# Patient Record
Sex: Male | Born: 1965 | Race: White | Hispanic: No | Marital: Married | Smoking: Never smoker
Health system: Southern US, Community
[De-identification: ages and names within clinical notes are randomized; demographics above are authoritative.]

## PROBLEM LIST (undated history)

## (undated) DIAGNOSIS — I10 Essential (primary) hypertension: Secondary | ICD-10-CM

---

## 2008-04-16 ENCOUNTER — Ambulatory Visit (HOSPITAL_COMMUNITY): Admission: RE | Admit: 2008-04-16 | Discharge: 2008-04-16 | Payer: Self-pay | Admitting: Emergency Medicine

## 2008-04-16 ENCOUNTER — Inpatient Hospital Stay (HOSPITAL_COMMUNITY): Admission: EM | Admit: 2008-04-16 | Discharge: 2008-04-18 | Payer: Self-pay | Admitting: Emergency Medicine

## 2008-04-16 ENCOUNTER — Ambulatory Visit: Payer: Self-pay | Admitting: Vascular Surgery

## 2008-07-14 ENCOUNTER — Ambulatory Visit (HOSPITAL_COMMUNITY): Admission: RE | Admit: 2008-07-14 | Discharge: 2008-07-14 | Payer: Self-pay | Admitting: Neurology

## 2011-02-12 NOTE — Discharge Summary (Signed)
Timothy Schmitt, Timothy Schmitt NO.:  [id number]   MEDICAL RECORD NO.:  [id number]          PATIENT TYPE:  INP   LOCATION:  4704                         FACILITY:  [facility]   PHYSICIAN:  Melvyn Novas, M.D.  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   MEDICINES AT THE TIME OF DISCHARGE:  1. Plavix 75 mg a day.  2. Aspirin 81 mg a day.   STUDIES PERFORMED:  1. Pre-MRI screening shows no radiopaque foreign bodies identified      within the eye.  2. MRA of the neck shows possible left internal carotid artery      dissection at the skull base.  3. CT angio of the head and neck shows normal CT angio of the head.  4. CT angio of the neck shows focal dissection of the left internal      carotid artery just below and within the proximal carotid canal.  5. Chest x-ray, no acute disease.  6. MRI of the brain shows no acute stroke.  7. MRA of the brain shows short segment dissection at the left      internal carotid artery and internal carotid artery narrowing      estimated at 50%.  Beyond that, the vessels appear normal.  8. EKG shows sinus bradycardia.   LABORATORY STUDIES:  INR on the day of discharge 1.2, homocystine 6.9,  HIV nonreactive, folate 19.6, vitamin B12 606, TSH 4.295, syphilis  nonreactive, hemoglobin A1c 5.5, C-reactive protein 0.4, and sed rate 1.  Lipid profile with cholesterol 120, triglycerides 143, HDL 24, LDL 67.  Blood type is O+.  Chemistry normal.  CBC normal.   HISTORY OF PRESENT ILLNESS:  Timothy Schmitt is a 45 year old right-  handed Caucasian male, who has no significant medical history.  He  worked 36 hours prior to admission with some funny vision in his left  eye when he awoke as if something were in his eye.  He also has some  blood  shot on the left according to his wife and left eyelid that was  slightly droopy.  He denied any trauma to his head or neck, although he  did bump his head with a pole on April 01, 2008, but he has had no  symptoms since that time.  [facility].  An MRA that he had as an outpatient showed probable internal  carotid artery dissection.  He was brought to the hospital for further  evaluation.  He is not a t-PA candidate secondary to he has had no  stroke symptoms.   HOSPITAL COURSE:  MRI and CTA of neck also confirmed left ICA dissection  possibly due to recent moving and heavy lifting, but no real source  identified.  He was initially placed on full-dose Lovenox and Coumadin  for stroke prevention, then changed to aspirin and  Plavix, as there is a  clinical benefit of Coumadin over antiplatelet.  He will be discharged  on aspirin and Plavix x3 months, then one will be discontinued.  We will  plan an angiogram at that time to evaluate dissection and possible  etiologies.  He had a full hypercoagulable workup done in the hospital.  He does have no neurologic deficits other than the Horner disease and is  stable for discharge home.  His family is supportive.   CONDITION AT DISCHARGE:  Horner's is resolving, though he still has some  left Horner's with ptosis and small pupil reaction on the left.  He is  otherwise neurologically intact.   DISCHARGE/PLAN:  1. Discharge home with family.  2. Aspirin and Plavix for stroke prevention.  3. Follow up with primary care physician for any risk factor control      within the next month.  4. Follow up with Dr. Porfirio Mylar Dohmeier in 2 months.  5. Followup cerebral angiogram in 2-3 months.  6. Stop one of the antiplatelet agents in 3 months.      Annie Main, N.P.      Melvyn Novas, M.D.  Electronically Signed    SB/MEDQ  D:  04/18/2008  T:  04/19/2008  Job:  [id number]   cc:   Quita Skye. Hart Rochester, M.D.   Gaspar Garbe, M.D.

## 2011-02-12 NOTE — Consult Note (Signed)
NAMEMCKINNLEY, SMITHEY NO.:  [id number]   MEDICAL RECORD NO.:  [id number]          PATIENT TYPE:  INP   LOCATION:  4704                         FACILITY:  [facility]   PHYSICIAN:  Quita Skye. Hart Rochester, M.D.  DATE OF BIRTH:  [birth year]   DATE OF CONSULTATION:  DATE OF DISCHARGE:                                 CONSULTATION   CHIEF COMPLAINT:  Acute left internal carotid dissection  (intracerebral).   HISTORY OF PRESENT ILLNESS:  This healthy 45 year old patient 36 hours  ago noted some funny vision in his left eye when he awoke as if  something were in his eye.  He also had some bloodshot eye on the  left, according to his wife in the left eyelid droop slightly.  He  denied any weakness, syncope, decreased sensation, aphasia, or other  neurologic symptoms.  He also denied any trauma to his head and neck  area; although, he did bump his head with a pole on April 01, 2008, but he  has had no symptoms since that time.  He has had no history of  neurologic issues in the past.  He was evaluated by [facility] Cleta Alberts [facility] and MR angiogram suggested the possible probable with  the internal carotid artery intracerebrally and the CT angiogram was  performed, which I have reviewed with Dr. Valetta Close which reveals 50-  70% narrowing of the internal carotid artery in the carotid canal on the  left side suggestive of a focal dissection.   PAST MEDICAL HISTORY:  Negative for diabetes, coronary artery disease,  COPD, stroke, or hypertension.   PAST SURGICAL HISTORY:  None.   ALLERGIES:  CODEINE.   MEDICATIONS:  None.   SOCIAL HISTORY:  Denies tobacco or ethanol use.  He is self-employed.   REVIEW OF SYSTEMS:  Totally unremarkable.   PHYSICAL EXAMINATION:  VITAL SIGNS:  Temperature 97.6, blood pressure  150/90, and heart rates 56.  GENERAL:  He is alert and oriented, healthy-appearing middle-aged male  in no apparent distress, alert and oriented x3.  NECK:  Supple.   3+ carotid pulses with no tenderness.  No bruits are  audible.  HEENT:  Left eyelid is slightly ptotic with some mild conjunctivitis on  the left.  No sensation changes or no speech problems.  He had good  strength bilaterally.  CHEST:  Clear to auscultation.  ABDOMEN:  Soft, nontender with no palpable masses.  CARDIOVASCULAR:  Regular rhythm with no murmurs.  EXTREMITY:  A 3+ femoral popliteal, dorsalis pedis pulses palpable  bilaterally.  Carotid pulses 3+ bilaterally.   IMPRESSION:  Probable acute dissection of left internal carotid artery  (intracerebral) in the carotid canal.   RECOMMENDATIONS:  We would obtain a Neurology consult for management of  this lesion.  It is not accessible to the vascular surgeon.  Most  likely, the best treatment would be 6 months of anticoagulation with  Coumadin and aspirin and at that time, if lesion is not apparent by CT  angiography, could consider discontinuing Coumadin therapy.  I will be  happy to see the  patient again at your request.      Quita Skye. Hart Rochester, M.D.  Electronically Signed     JDL/MEDQ  D:  04/16/2008  T:  04/17/2008  Job:  [id number]

## 2011-06-28 LAB — DIFFERENTIAL
Basophils Absolute: 0
Basophils Relative: 1
Lymphocytes Relative: 17
Monocytes Absolute: 0.4
Neutro Abs: 4.7
Neutrophils Relative %: 74

## 2011-06-28 LAB — POCT I-STAT, CHEM 8
Chloride: 102
Creatinine, Ser: 1.1

## 2011-06-28 LAB — TYPE AND SCREEN
ABO/RH(D): O POS
Antibody Screen: NEGATIVE

## 2011-06-28 LAB — ANTI-NEUTROPHIL ANTIBODY

## 2011-06-28 LAB — LIPID PANEL
Cholesterol: 120
Triglycerides: 143
VLDL: 29

## 2011-06-28 LAB — PROTIME-INR
INR: 1
INR: 1.1
INR: 1.2
Prothrombin Time: 15.7 — ABNORMAL HIGH

## 2011-06-28 LAB — EXTRACTABLE NUCLEAR ANTIGEN ANTIBODY
SSA (Ro) (ENA) Antibody, IgG: <0.2 AI (ref <1.0)
SSB (La) (ENA) Antibody, IgG: 0.2 AI (ref <1.0)
Scleroderma (Scl-70) (ENA) Antibody, IgG: <0.2 AI (ref <1.0)
ds DNA Ab: 1 IU/mL (ref <5)

## 2011-06-28 LAB — CBC
HCT: 45.4
Hemoglobin: 15.6
MCV: 89.3
Platelets: 195
RBC: 5.08
RDW: 12.8

## 2011-06-28 LAB — RPR: RPR Ser Ql: NONREACTIVE

## 2011-06-28 LAB — ABO/RH: ABO/RH(D): O POS

## 2011-06-28 LAB — METHYLMALONIC ACID, SERUM: Methylmalonic Acid, Quantitative: 124 nmol/L (ref 87–318)

## 2011-06-28 LAB — TSH: TSH: 4.295

## 2011-06-28 LAB — HIV ANTIBODY (ROUTINE TESTING W REFLEX): HIV: NONREACTIVE

## 2011-06-28 LAB — HEMOGLOBIN A1C: Mean Plasma Glucose: 119

## 2014-02-01 ENCOUNTER — Other Ambulatory Visit: Payer: Self-pay | Admitting: Internal Medicine

## 2014-02-01 DIAGNOSIS — N5089 Other specified disorders of the male genital organs: Secondary | ICD-10-CM

## 2014-02-04 ENCOUNTER — Ambulatory Visit
Admission: RE | Admit: 2014-02-04 | Discharge: 2014-02-04 | Disposition: A | Discharge status: Home / Self Care | Payer: Managed Care, Other (non HMO) | Source: Ambulatory Visit | Attending: Internal Medicine | Admitting: Internal Medicine

## 2014-02-04 DIAGNOSIS — N5089 Other specified disorders of the male genital organs: Secondary | ICD-10-CM

## 2016-12-24 ENCOUNTER — Emergency Department (HOSPITAL_COMMUNITY)
Admission: EM | Admit: 2016-12-24 | Discharge: 2016-12-24 | Disposition: A | Discharge status: Home / Self Care | Payer: Managed Care, Other (non HMO) | Attending: Emergency Medicine | Admitting: Emergency Medicine

## 2016-12-24 ENCOUNTER — Emergency Department (HOSPITAL_COMMUNITY): Payer: Managed Care, Other (non HMO)

## 2016-12-24 ENCOUNTER — Encounter (HOSPITAL_COMMUNITY): Payer: Self-pay

## 2016-12-24 DIAGNOSIS — R42 Dizziness and giddiness: Secondary | ICD-10-CM | POA: Diagnosis not present

## 2016-12-24 DIAGNOSIS — R202 Paresthesia of skin: Secondary | ICD-10-CM | POA: Insufficient documentation

## 2016-12-24 DIAGNOSIS — Z5181 Encounter for therapeutic drug level monitoring: Secondary | ICD-10-CM | POA: Diagnosis not present

## 2016-12-24 DIAGNOSIS — R930 Abnormal findings on diagnostic imaging of skull and head, not elsewhere classified: Secondary | ICD-10-CM | POA: Insufficient documentation

## 2016-12-24 DIAGNOSIS — R51 Headache: Secondary | ICD-10-CM | POA: Diagnosis present

## 2016-12-24 LAB — PROTIME-INR
INR: 1.01
PROTHROMBIN TIME: 13.3 s (ref 11.4–15.2)

## 2016-12-24 LAB — BASIC METABOLIC PANEL
Anion gap: 7 (ref 5–15)
BUN: 13 mg/dL (ref 6–20)
CO2: 25 mmol/L (ref 22–32)
CREATININE: 0.95 mg/dL (ref 0.61–1.24)
Calcium: 9.3 mg/dL (ref 8.9–10.3)
Chloride: 103 mmol/L (ref 101–111)
GFR calc Af Amer: >60 mL/min (ref >60)
Glucose, Bld: 92 mg/dL (ref 65–99)
Potassium: 4.1 mmol/L (ref 3.5–5.1)
SODIUM: 135 mmol/L (ref 135–145)

## 2016-12-24 LAB — CBC WITH DIFFERENTIAL/PLATELET
Basophils Absolute: 0.1 10*3/uL (ref 0.0–0.1)
Basophils Relative: 1 %
EOS ABS: 0.3 10*3/uL (ref 0.0–0.7)
EOS PCT: 5 %
HCT: 45.6 % (ref 39.0–52.0)
Hemoglobin: 15.8 g/dL (ref 13.0–17.0)
LYMPHS ABS: 1.5 10*3/uL (ref 0.7–4.0)
Lymphocytes Relative: 27 %
MCH: 30.1 pg (ref 26.0–34.0)
MCHC: 34.6 g/dL (ref 30.0–36.0)
MCV: 86.9 fL (ref 78.0–100.0)
MONOS PCT: 7 %
Monocytes Absolute: 0.4 10*3/uL (ref 0.1–1.0)
NEUTROS ABS: 3.3 10*3/uL (ref 1.7–7.7)
Neutrophils Relative %: 60 %
PLATELETS: 190 10*3/uL (ref 150–400)
RBC: 5.25 MIL/uL (ref 4.22–5.81)
RDW: 12.8 % (ref 11.5–15.5)
WBC: 5.4 10*3/uL (ref 4.0–10.5)

## 2016-12-24 LAB — APTT: aPTT: 34 seconds (ref 24–36)

## 2016-12-24 MED ORDER — MECLIZINE HCL 32 MG PO TABS: 32.0000 mg | ORAL_TABLET | Freq: Three times a day (TID) | ORAL | 0 refills | Status: DC | PRN

## 2016-12-24 MED ORDER — GADOBENATE DIMEGLUMINE 529 MG/ML IV SOLN
15.0000 mL | Freq: Once | INTRAVENOUS | Status: AC
  Administered 2016-12-24: 15 mL via INTRAVENOUS

## 2016-12-24 NOTE — ED Notes (Signed)
Patient states he has been monitoring his blood pressure for for several days and is suppose to get a blood pressure cuff from his MD to monitor his b/p for 24 hours. States this am felt pressure in the right side of his head and felt "fuzzy" states if he turns his head quickly to either side he gets dizzy. Wife at bedside.

## 2016-12-24 NOTE — Discharge Instructions (Signed)
Please take her meclizine and perform your Epley maneuvers to help with your vertigo. Please follow-up with neurology for further management. If any symptoms change or worsen or you begin to having new symptoms, please return to the nearest emergency department.

## 2016-12-24 NOTE — ED Notes (Signed)
Patient being transported to MRI

## 2016-12-24 NOTE — ED Provider Notes (Signed)
[facility] Provider Note   CSN: [id number] Arrival date & time: 12/24/16  1227  By signing my name below, I, Marnette Burgessyan Andrew Long, attest that this documentation has been prepared under the direction and in the presence of Heide Scaleshristopher J Elston Aldape, MD. Electronically Signed: Marnette Burgessyan Andrew Long, Scribe. 12/24/2016. 1:36 PM.   History   Chief Complaint Chief Complaint  Patient presents with  . Headache   The history is provided by the patient, medical records and the spouse. No language interpreter was used.  Neurologic Problem  This is a new problem. The current episode started 3 to 5 hours ago. The problem occurs constantly. The problem has not changed since onset.Associated symptoms include headaches. Pertinent negatives include no chest pain and no abdominal pain. The symptoms are aggravated by twisting (position). Nothing relieves the symptoms. He has tried nothing for the symptoms. The treatment provided no relief.  Headache   This is a new problem. The current episode started 3 to 5 hours ago. The problem occurs constantly. The problem has not changed since onset.  HPI Comments:  Timothy Schmitt is a 51 y.o. male with a past medical hx significant for prior intracranial dissection, who presents to the Emergency Department complaining of a right temporal discomfort, dizziness, and R leg tingling onset 0830 this morning. Pt reports waking up at his baseline and eating his normal breakfast followed by his daily inversion table therapy during which he also twists and cricks his neck trying to stretch it out. After getting off the table, he felt room spinning dizziness and had to sit on one knee for a minute to keep his balance. He then went to work and felt some slight temporal discomfort with right eye "visual disturbance with facial tingling". He doesn't classify it as pain but states he felt discomfort saying "something wasn't quite right". His wife reports a past h/o left ICA dissection in  2009 treated with ASA and Plavix for three months which he reports he feels somewhat similar to today. No current antiplatelet or anticoagulant use. She also states he has been having persistent blood pressure issues this past month. After feeling this facial discomfort this morning, he called his PCP who referred him to come to [facility]. He states the dizziness is somewhat persistent at current time with associated very mild right leg numbness >left leg. Pt denies weakness and any other complaints at this time.    History reviewed. No pertinent past medical history.  There are no active problems to display for this patient.  History reviewed. No pertinent surgical history.  Home Medications    Prior to Admission medications   Medication Sig Start Date End Date Taking? Authorizing Provider  ibuprofen (ADVIL,MOTRIN) 200 MG tablet Take 200-800 mg by mouth every 6 (six) hours as needed for headache.   Yes Historical Provider, MD   Family History History reviewed. No pertinent family history.  Social History Social History  Substance Use Topics  . Smoking status: Never Smoker  . Smokeless tobacco: Never Used  . Alcohol use Not on file   Allergies   Codeine  Review of Systems Review of Systems  Constitutional: Negative for activity change, chills, diaphoresis and fatigue.  HENT: Negative for congestion and rhinorrhea.   Eyes: Positive for visual disturbance.  Respiratory: Negative for cough, chest tightness, wheezing and stridor.   Cardiovascular: Negative for chest pain and leg swelling.  Gastrointestinal: Negative for abdominal distention, abdominal pain, blood in stool, constipation and diarrhea.  Genitourinary: Negative for  difficulty urinating, dysuria and flank pain.  Musculoskeletal: Negative for back pain and gait problem.  Skin: Negative for rash and wound.  Neurological: Positive for dizziness and headaches. Negative for weakness and light-headedness.       Positive facial  tingling  Psychiatric/Behavioral: Negative for agitation.  All other systems reviewed and are negative.   Physical Exam Updated Vital Signs BP (!) 149/104   Pulse 68   Temp 98 F (36.7 C) (Oral)   Resp 18   Ht 5\' 10"  (1.778 m)   Wt 163 lb (73.9 kg)   SpO2 98%   BMI 23.39 kg/m   Physical Exam  Constitutional: He is oriented to person, place, and time. He appears well-developed and well-nourished. No distress.  HENT:  Head: Normocephalic and atraumatic.  Right Ear: External ear normal.  Left Ear: External ear normal.  Nose: Nose normal.  Mouth/Throat: Oropharynx is clear and moist. No oropharyngeal exudate.  Eyes: Conjunctivae and EOM are normal. Pupils are equal, round, and reactive to light.  Neck: Normal range of motion. Neck supple.  Pulmonary/Chest: No stridor. No respiratory distress.  Abdominal: Soft. There is no tenderness. There is no rebound and no guarding.  Musculoskeletal: He exhibits no edema.  Neurological: He is alert and oriented to person, place, and time. He displays normal reflexes. No cranial nerve deficit. He exhibits normal muscle tone. Coordination normal.  Skin: Skin is warm. No rash noted. He is not diaphoretic. No erythema.    ED Treatments / Results  DIAGNOSTIC STUDIES:  Oxygen Saturation is 98% on RA, normal by my interpretation.    COORDINATION OF CARE:  1:30 PM Discussed treatment plan with pt at bedside including blood work, MRI Head/ Neck and pt agreed to plan.  1:38 PM Talked to Dr. Amada Jupiter in neurology who advised not to call code stroke but get a mri of head and neck.   Labs (all labs ordered are listed, but only abnormal results are displayed) Labs Reviewed  CBC WITH DIFFERENTIAL/PLATELET  BASIC METABOLIC PANEL  PROTIME-INR  APTT    EKG  EKG Interpretation  Date/Time:  Tuesday December 24 2016 12:57:28 EDT Ventricular Rate:  61 PR Interval:  190 QRS Duration: 90 QT Interval:  408 QTC Calculation: 410 R  Axis:   68 Text Interpretation:  Normal sinus rhythm Normal ECG When compared to prior, no significant changes seen.  No STEMI Confirmed by Rush Landmark MD, Marisela Line [phone]) on 12/24/2016 2:19:05 PM      Radiology Mr Maxine Glenn Head Wo Contrast  Result Date: 12/24/2016 CLINICAL DATA:  Vertigo, visual disturbance with facial tingling. History of left carotid dissection. EXAM: MR HEAD WITHOUT CONTRAST MR CIRCLE OF WILLIS WITHOUT CONTRAST MRA OF THE NECK WITHOUT AND WITH CONTRAST TECHNIQUE: Multiplanar, multiecho pulse sequences of the brain, circle of willis and surrounding structures were obtained without intravenous contrast. Angiographic images of the neck were obtained using MRA technique without and with intravenous contrast. CONTRAST:  15mL MULTIHANCE GADOBENATE DIMEGLUMINE 529 MG/ML IV SOLN COMPARISON:  CTA head neck 04/16/2008, 07/14/2008 FINDINGS: MR HEAD FINDINGS Brain: Ventricle size normal. Cerebral volume normal. Negative for acute or chronic infarction. Negative for hemorrhage mass or edema. Vascular: Normal arterial flow voids. Skull and upper cervical spine: Negative Sinuses/Orbits: Mild mucosal edema in the paranasal sinuses. Normal orbit. Other: None MR CIRCLE OF WILLIS FINDINGS Both vertebral arteries patent to the basilar. PICA patent bilaterally. Left AICA patent. Superior cerebellar and posterior cerebral arteries patent without stenosis. Fetal origin left posterior cerebral artery. Basilar is  widely patent. Distal internal carotid artery is patent through the cavernous segment without irregularity or stenosis. Prior dissection was at the level of the left carotid canal and this vessel appears normal today. Anterior and middle cerebral arteries appear normal bilaterally. Negative for cerebral aneurysm. MRA NECK FINDINGS Normal aortic arch. Carotid bifurcation normal. Both vertebral arteries are normal. Antegrade flow in both carotid and vertebral arteries is noted. IMPRESSION: Negative MRI of the  brain Negative MRA circle of Willis Negative MRA neck History of prior left carotid dissection at the level of the carotid canal at the skullbase. This has healed and appears normal. Electronically Signed   By: Marlan Palau M.D.   On: 12/24/2016 15:54   Mr Angiogram Neck W Or Wo Contrast  Result Date: 12/24/2016 CLINICAL DATA:  Vertigo, visual disturbance with facial tingling. History of left carotid dissection. EXAM: MR HEAD WITHOUT CONTRAST MR CIRCLE OF WILLIS WITHOUT CONTRAST MRA OF THE NECK WITHOUT AND WITH CONTRAST TECHNIQUE: Multiplanar, multiecho pulse sequences of the brain, circle of willis and surrounding structures were obtained without intravenous contrast. Angiographic images of the neck were obtained using MRA technique without and with intravenous contrast. CONTRAST:  15mL MULTIHANCE GADOBENATE DIMEGLUMINE 529 MG/ML IV SOLN COMPARISON:  CTA head neck 04/16/2008, 07/14/2008 FINDINGS: MR HEAD FINDINGS Brain: Ventricle size normal. Cerebral volume normal. Negative for acute or chronic infarction. Negative for hemorrhage mass or edema. Vascular: Normal arterial flow voids. Skull and upper cervical spine: Negative Sinuses/Orbits: Mild mucosal edema in the paranasal sinuses. Normal orbit. Other: None MR CIRCLE OF WILLIS FINDINGS Both vertebral arteries patent to the basilar. PICA patent bilaterally. Left AICA patent. Superior cerebellar and posterior cerebral arteries patent without stenosis. Fetal origin left posterior cerebral artery. Basilar is widely patent. Distal internal carotid artery is patent through the cavernous segment without irregularity or stenosis. Prior dissection was at the level of the left carotid canal and this vessel appears normal today. Anterior and middle cerebral arteries appear normal bilaterally. Negative for cerebral aneurysm. MRA NECK FINDINGS Normal aortic arch. Carotid bifurcation normal. Both vertebral arteries are normal. Antegrade flow in both carotid and vertebral  arteries is noted. IMPRESSION: Negative MRI of the brain Negative MRA circle of Willis Negative MRA neck History of prior left carotid dissection at the level of the carotid canal at the skullbase. This has healed and appears normal. Electronically Signed   By: Marlan Palau M.D.   On: 12/24/2016 15:54   Mr Brain Wo Contrast  Result Date: 12/24/2016 CLINICAL DATA:  Vertigo, visual disturbance with facial tingling. History of left carotid dissection. EXAM: MR HEAD WITHOUT CONTRAST MR CIRCLE OF WILLIS WITHOUT CONTRAST MRA OF THE NECK WITHOUT AND WITH CONTRAST TECHNIQUE: Multiplanar, multiecho pulse sequences of the brain, circle of willis and surrounding structures were obtained without intravenous contrast. Angiographic images of the neck were obtained using MRA technique without and with intravenous contrast. CONTRAST:  15mL MULTIHANCE GADOBENATE DIMEGLUMINE 529 MG/ML IV SOLN COMPARISON:  CTA head neck 04/16/2008, 07/14/2008 FINDINGS: MR HEAD FINDINGS Brain: Ventricle size normal. Cerebral volume normal. Negative for acute or chronic infarction. Negative for hemorrhage mass or edema. Vascular: Normal arterial flow voids. Skull and upper cervical spine: Negative Sinuses/Orbits: Mild mucosal edema in the paranasal sinuses. Normal orbit. Other: None MR CIRCLE OF WILLIS FINDINGS Both vertebral arteries patent to the basilar. PICA patent bilaterally. Left AICA patent. Superior cerebellar and posterior cerebral arteries patent without stenosis. Fetal origin left posterior cerebral artery. Basilar is widely patent. Distal internal carotid artery is  patent through the cavernous segment without irregularity or stenosis. Prior dissection was at the level of the left carotid canal and this vessel appears normal today. Anterior and middle cerebral arteries appear normal bilaterally. Negative for cerebral aneurysm. MRA NECK FINDINGS Normal aortic arch. Carotid bifurcation normal. Both vertebral arteries are normal.  Antegrade flow in both carotid and vertebral arteries is noted. IMPRESSION: Negative MRI of the brain Negative MRA circle of Willis Negative MRA neck History of prior left carotid dissection at the level of the carotid canal at the skullbase. This has healed and appears normal. Electronically Signed   By: Marlan Palau M.D.   On: 12/24/2016 15:54    Procedures Procedures (including critical care time)  Medications Ordered in ED Medications  gadobenate dimeglumine (MULTIHANCE) injection 15 mL (15 mLs Intravenous Contrast Given 12/24/16 1514)     Initial Impression / Assessment and Plan / ED Course  I have reviewed the triage vital signs and the nursing notes.  Pertinent labs & imaging results that were available during my care of the patient were reviewed by me and considered in my medical decision making (see chart for details).     Timothy Schmitt is a 51 y.o. male with a past medical hx significant for prior intracranial dissection, who presents to the Emergency Department complaining of a right temporal discomfort, dizziness, and R leg tingling onset 0830 this morning.   History and exam are seen above.   On exam, patient had tingling in his right leg versus left. Patient had no facial numbness or facial droop. No disorientation or other focal neurologic deficits. No weakness. No coordination problems. Normal finger-nose-finger. Clear lungs. Nontender chest. Nontender abdomen. No neck tenderness.  Shortly after exam and revelation that symptoms are similar to prior intracranial dissection in the past, neurology was called. They felt patient did not need to be a code stroke given the symptoms. They did however recommend MRI and MRA of the head and neck to further evaluate.  Patient's imaging showed resolution of prior dissection and no evidence of acute stroke or dissection. Patient's lab testing otherwise unremarkable.   Neurology saw the patient after imaging and felt patient's  symptoms were BP PV.   Dix-Hallpike maneuver was performed and patient had severe nystagmus and symptoms. Supplemented was performed with improvement in symptoms. Patient will be given prescription for meclizine and follow-up with neurology.  Do not feel patient has a stroke however, patient was given strict return precautions for any new or worsening symptoms. Patient had no other questions or concerns and patient was discharged in good condition with improvement in presenting symptoms.     Final Clinical Impressions(s) / ED Diagnoses   Final diagnoses:  Dizziness  Vertigo    New Prescriptions New Prescriptions   MECLIZINE (ANTIVERT) 32 MG TABLET    Take 1 tablet (32 mg total) by mouth 3 (three) times daily as needed.   I personally performed the services described in this documentation, which was scribed in my presence. The recorded information has been reviewed and is accurate.  Clinical Impression: 1. Vertigo   2. Dizziness     Disposition: Discharge  Condition: Good  I have discussed the results, Dx and Tx plan with the pt(& family if present). He/she/they expressed understanding and agree(s) with the plan. Discharge instructions discussed at great length. Strict return precautions discussed and pt &/or family have verbalized understanding of the instructions. No further questions at time of discharge.    New Prescriptions  MECLIZINE (ANTIVERT) 32 MG TABLET    Take 1 tablet (32 mg total) by mouth 3 (three) times daily as needed.    Follow Up: [facility] [address]     [address] [place] [address] [phone] Schedule an appointment as soon as possible for a visit    [facility] EMERGENCY DEPARTMENT [address] [id number] mc [place] [address] [phone]  If symptoms worsen      Heide Scales, MD 12/24/16 2145

## 2016-12-24 NOTE — ED Triage Notes (Signed)
Pt presents with onset of R temporal headache, "fuzzy" feeling and dizziness when he turns his head.  Pt has been seen by PCP x 1 month for hypertension.

## 2016-12-24 NOTE — Consult Note (Signed)
Requesting Physician: Dr. Julieanne Mansonegler    Chief Complaint:  Vertigo which has resolved.   History obtained from:  Patient    HPI:                                                                                                                                         Timothy Schmitt is an 51 y.o. male is that he awoke this morning and he felt at his baseline. He got on his inversion table to which he does every morning to stretch his back and upon getting back to vertical position he noted sudden onset of dizziness and vertigo. He unstrapped himself and had to get one need to keep his balance. This seemed to resolve and he went on to work however during the day he noted he just didn't feel right. He states he felt pressure behind his right eye and the right aspect of his head. He denies any neck pain neck discomfort or visual disturbances. He came to the emergency department and was feeling okay until he sat down in the waiting room when suddenly he noted the vertigo/dizziness occurred again. CT of head was obtained which showed no abnormalities MRI of brain/MRA of neck/MRA of head was obtained at this time MRI of brain and/MRA of head does not show anything suspecting dissection however the formal reading is still pending. Patient is asymptomatic at this time.  While doing the Dix-Hallpike and turning his head to the right he had severe vertigo and rotational nystagmus.  Date last known well: Date: 12/24/2016 Time last known well: Time: 08:30 tPA Given: No: symptoms resolved   History reviewed. No pertinent past medical history.  History reviewed. No pertinent surgical history.  History reviewed. No pertinent family history. Social History:  reports that he has never smoked. He has never used smokeless tobacco. His alcohol and drug histories are not on file.  Allergies:  Allergies  Allergen Reactions  . Codeine Nausea And Vomiting    Medications:                                                                                                                            No current facility-administered medications for this encounter.    Current Outpatient Prescriptions  Medication Sig Dispense Refill  . ibuprofen (ADVIL,MOTRIN) 200 MG tablet Take 200-800 mg by mouth every 6 (  six) hours as needed for headache.       ROS:                                                                                                                                       History obtained from the patient  General ROS: negative for - chills, fatigue, fever, night sweats, weight gain or weight loss Psychological ROS: negative for - behavioral disorder, hallucinations, memory difficulties, mood swings or suicidal ideation Ophthalmic ROS: negative for - blurry vision, double vision, eye pain or loss of vision ENT ROS: negative for - vertigo and dizziness Allergy and Immunology ROS: negative for - hives or itchy/watery eyes Hematological and Lymphatic ROS: negative for - bleeding problems, bruising or swollen lymph nodes Endocrine ROS: negative for - galactorrhea, hair pattern changes, polydipsia/polyuria or temperature intolerance Respiratory ROS: negative for - cough, hemoptysis, shortness of breath or wheezing Cardiovascular ROS: negative for - chest pain, dyspnea on exertion, edema or irregular heartbeat Gastrointestinal ROS: negative for - abdominal pain, diarrhea, hematemesis, nausea/vomiting or stool incontinence Genito-Urinary ROS: negative for - dysuria, hematuria, incontinence or urinary frequency/urgency Musculoskeletal ROS: negative for - joint swelling or muscular weakness Neurological ROS: as noted in HPI Dermatological ROS: negative for rash and skin lesion changes  Neurologic Examination:                                                                                                      Blood pressure (!) 149/104, pulse 68, temperature 98 F (36.7 C), temperature source Oral, resp. rate  18, height 5\' 10"  (1.778 m), weight 73.9 kg (163 lb), SpO2 98 %.  HEENT-  Normocephalic, no lesions, without obvious abnormality.  Normal external eye and conjunctiva.  Normal TM's bilaterally.  Normal auditory canals and external ears. Normal external nose, mucus membranes and septum.  Normal pharynx. Cardiovascular- S1, S2 normal, pulses palpable throughout   Lungs- chest clear, no wheezing, rales, normal symmetric air entry Abdomen- normal findings: bowel sounds normal Extremities- no edema Lymph-no adenopathy palpable Musculoskeletal-no joint tenderness, deformity or swelling Skin-warm and dry, no hyperpigmentation, vitiligo, or suspicious lesions  Neurological Examination Mental Status: Alert, oriented, thought content appropriate.  Speech fluent without evidence of aphasia.  Able to follow 3 step commands without difficulty. Cranial Nerves: II: Discs flat bilaterally; Visual fields grossly normal,  III,IV, VI: ptosis not present, extra-ocular motions intact bilaterally, pupils equal, round, reactive to light and accommodation V,VII: smile symmetric, facial light touch sensation normal bilaterally VIII:  hearing normal bilaterally IX,X: uvula rises symmetrically XI: bilateral shoulder shrug XII: midline tongue extension Motor: Right : Upper extremity   5/5    Left:     Upper extremity   5/5  Lower extremity   5/5     Lower extremity   5/5 Tone and bulk:normal tone throughout; no atrophy noted Sensory: Pinprick and light touch intact throughout, bilaterally Deep Tendon Reflexes: 2+ and symmetric throughout Plantars: Right: downgoing   Left: downgoing Cerebellar: normal finger-to-nose, normal rapid alternating movements and normal heel-to-shin test Gait: Normal gait, heel to shin had some difficulty and fell to the right but this was corrected after the second time of attempting. No difficulty walking on heels or on use.       Lab Results: Basic Metabolic Panel:  Recent  Labs Lab 12/24/16 1302  NA 135  K 4.1  CL 103  CO2 25  GLUCOSE 92  BUN 13  CREATININE 0.95  CALCIUM 9.3    Liver Function Tests: No results for input(s): AST, ALT, ALKPHOS, BILITOT, PROT, ALBUMIN in the last 168 hours. No results for input(s): LIPASE, AMYLASE in the last 168 hours. No results for input(s): AMMONIA in the last 168 hours.  CBC:  Recent Labs Lab 12/24/16 1302  WBC 5.4  NEUTROABS 3.3  HGB 15.8  HCT 45.6  MCV 86.9  PLT 190    Cardiac Enzymes: No results for input(s): CKTOTAL, CKMB, CKMBINDEX, TROPONINI in the last 168 hours.  Lipid Panel: No results for input(s): CHOL, TRIG, HDL, CHOLHDL, VLDL, LDLCALC in the last 168 hours.  CBG: No results for input(s): GLUCAP in the last 168 hours.  Microbiology: No results found for this or any previous visit.  Coagulation Studies:  Recent Labs  12/24/16 1344  LABPROT 13.3  INR 1.01    Imaging: No results found.     Assessment and plan discussed with with attending physician and they are in agreement.    Felicie Morn PA-C [facility] [phone]  12/24/2016, 3:46 PM   Assessment: 51 y.o. male with transient vertigo status post using his inverted table and then returning to vertical position. Given negative MRI and positive Dix-Hallpike test most likely patient has loosened an otolith which is causing him vertigo.  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  2) I have provided the patient with up and out for modified Epley maneuver 3) could use meclizine, though I counseled him not to use it while driving. 4) he can follow up with outpatient neurology.  Ritta Slot, MD [facility] [phone]  If 7pm- 7am, please page neurology on call as listed in [facility].

## 2017-06-23 ENCOUNTER — Emergency Department (HOSPITAL_BASED_OUTPATIENT_CLINIC_OR_DEPARTMENT_OTHER)
Admission: EM | Admit: 2017-06-23 | Discharge: 2017-06-24 | Disposition: A | Discharge status: Home / Self Care | Payer: Managed Care, Other (non HMO) | Attending: Emergency Medicine | Admitting: Emergency Medicine

## 2017-06-23 ENCOUNTER — Emergency Department (HOSPITAL_BASED_OUTPATIENT_CLINIC_OR_DEPARTMENT_OTHER): Payer: Managed Care, Other (non HMO)

## 2017-06-23 DIAGNOSIS — R42 Dizziness and giddiness: Secondary | ICD-10-CM | POA: Diagnosis not present

## 2017-06-23 DIAGNOSIS — I10 Essential (primary) hypertension: Secondary | ICD-10-CM | POA: Diagnosis not present

## 2017-06-23 DIAGNOSIS — Z79899 Other long term (current) drug therapy: Secondary | ICD-10-CM | POA: Insufficient documentation

## 2017-06-23 DIAGNOSIS — R531 Weakness: Secondary | ICD-10-CM | POA: Insufficient documentation

## 2017-06-23 LAB — BASIC METABOLIC PANEL
ANION GAP: 7 (ref 5–15)
BUN: 12 mg/dL (ref 6–20)
CALCIUM: 9.3 mg/dL (ref 8.9–10.3)
CO2: 25 mmol/L (ref 22–32)
CREATININE: 1.12 mg/dL (ref 0.61–1.24)
Chloride: 105 mmol/L (ref 101–111)
Glucose, Bld: 128 mg/dL — ABNORMAL HIGH (ref 65–99)
Potassium: 3.8 mmol/L (ref 3.5–5.1)
SODIUM: 137 mmol/L (ref 135–145)

## 2017-06-23 LAB — CBC
HCT: 46.1 % (ref 39.0–52.0)
HEMOGLOBIN: 16.4 g/dL (ref 13.0–17.0)
MCH: 30 pg (ref 26.0–34.0)
MCHC: 35.6 g/dL (ref 30.0–36.0)
MCV: 84.3 fL (ref 78.0–100.0)
Platelets: 192 10*3/uL (ref 150–400)
RBC: 5.47 MIL/uL (ref 4.22–5.81)
RDW: 12.9 % (ref 11.5–15.5)
WBC: 11.6 10*3/uL — AB (ref 4.0–10.5)

## 2017-06-23 LAB — TROPONIN I: Troponin I: <0.03 ng/mL (ref <0.03)

## 2017-06-23 MED ORDER — MECLIZINE HCL 25 MG PO TABS
25.0000 mg | ORAL_TABLET | Freq: Once | ORAL | Status: AC
  Administered 2017-06-24: 25 mg via ORAL
  Filled 2017-06-23: qty 1

## 2017-06-23 MED ORDER — ONDANSETRON HCL 4 MG/2ML IJ SOLN
4.0000 mg | Freq: Once | INTRAMUSCULAR | Status: AC
  Administered 2017-06-24: 4 mg via INTRAVENOUS
  Filled 2017-06-23: qty 2

## 2017-06-23 MED ORDER — SODIUM CHLORIDE 0.9 % IV BOLUS (SEPSIS)
1000.0000 mL | Freq: Once | INTRAVENOUS | Status: AC
  Administered 2017-06-24: 1000 mL via INTRAVENOUS

## 2017-06-23 MED ORDER — KETOROLAC TROMETHAMINE 30 MG/ML IJ SOLN
30.0000 mg | Freq: Once | INTRAMUSCULAR | Status: AC
  Administered 2017-06-24: 30 mg via INTRAVENOUS
  Filled 2017-06-23: qty 1

## 2017-06-23 NOTE — ED Triage Notes (Signed)
Epigastric pain this am. Lightheaded. Almost felt like he was having vertigo. He has been vomited and having periods of diaphoresis since lunch. 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 His EKG was normal but he was hypertensive.

## 2017-06-23 NOTE — ED Notes (Signed)
ED Provider at bedside. 

## 2017-06-24 MED ORDER — MECLIZINE HCL 25 MG PO TABS: 25.0000 mg | ORAL_TABLET | Freq: Three times a day (TID) | ORAL | 0 refills | Status: DC | PRN

## 2017-06-24 NOTE — ED Notes (Signed)
Pt reports headache has decreased to 1/10, dizziness has subsided and he "feels much better".

## 2017-06-24 NOTE — ED Notes (Signed)
ED Provider at bedside. 

## 2017-06-24 NOTE — ED Provider Notes (Signed)
[facility] Note   CSN: [id number] Arrival date & time: 06/23/17  2055     History   Chief Complaint Chief Complaint  Patient presents with  . Hypertension  . Dizziness  . Weakness    HPI Timothy Schmitt is a 51 y.o. male.  The history is provided by the patient.  Hypertension  This is a new problem. The current episode started 6 to 12 hours ago. The problem occurs constantly. The problem has been gradually improving. Associated symptoms include headaches. Pertinent negatives include no chest pain, no abdominal pain and no shortness of breath. Nothing aggravates the symptoms. He has tried rest for the symptoms.  Dizziness  Quality:  Room spinning Severity:  Moderate Onset quality:  Gradual Timing:  Constant Progression:  Improving Chronicity:  New Relieved by:  Being still Worsened by:  Standing up Associated symptoms: headaches, nausea, vomiting and weakness   Associated symptoms: no chest pain, no hearing loss, no shortness of breath, no syncope, no tinnitus and no vision changes   Risk factors: hx of vertigo   Weakness  Primary symptoms include dizziness.  Primary symptoms include no visual change. Associated symptoms include vomiting and headaches. Pertinent negatives include no shortness of breath and no chest pain.  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  He completed course of ASA/plavix  PMH - distant h/o carotid dissection Home Medications    Prior to Admission medications   Medication Sig Start Date End Date Taking?  Authorizing Provider  ibuprofen (ADVIL,MOTRIN) 200 MG tablet Take 200-800 mg by mouth every 6 (six) hours as needed for headache.    [provider]  meclizine (ANTIVERT) 32 MG tablet Take 1 tablet (32 mg total) by mouth 3 (three) times daily as needed. 12/24/16   Tegeler, Canary Brim, MD    Family History No family history on file.  Social History Social History  Substance Use Topics  . Smoking status: Never Smoker  . Smokeless tobacco: Never Used  . Alcohol use Not on file     Allergies   Codeine   Review of Systems Review of Systems  Constitutional: Positive for diaphoresis. Negative for fever.  HENT: Negative for hearing loss and tinnitus.   Respiratory: Negative for shortness of breath.   Cardiovascular: Negative for chest pain and syncope.  Gastrointestinal: Positive for nausea and vomiting. Negative for abdominal pain.  Neurological: Positive for dizziness, weakness and headaches. Negative for syncope and numbness.  All other systems reviewed and are negative.    Physical Exam Updated Vital Signs BP (!) 162/102   Pulse 61   Temp 97.8 F (36.6 C)   Resp 17   Ht 1.778 m ( )   Wt 77.6 kg (171 lb)   SpO2 98%   BMI 24.54 kg/m   Physical Exam CONSTITUTIONAL: Well developed/well nourished HEAD: Normocephalic/atraumatic EYES: EOMI/PERRL, minimal horizontal nystagmus, no ptosis,  ENMT: Mucous membranes moist NECK: supple no meningeal signs, no bruits SPINE/BACK:entire spine nontender CV: S1/S2 noted, no murmurs/rubs/gallops noted LUNGS: Lungs are clear to auscultation bilaterally, no apparent distress ABDOMEN: soft, nontender,  no rebound or guarding GU:no cva tenderness NEURO:Awake/alert, face symmetric, no arm or leg drift is noted Equal 5/5 strength with shoulder abduction, elbow flex/extension, wrist flex/extension in upper extremities and equal hand grips bilaterally Equal 5/5 strength with hip flexion,knee flex/extension, foot dorsi/plantar  flexion Cranial nerves 3/4/5/6/04/07/09/11/12 tested and intact Gait normal without ataxia No past pointing Sensation to light touch intact in all extremities EXTREMITIES: pulses normal, full ROM SKIN: warm, color normal PSYCH: no abnormalities of mood noted, alert and oriented to situation    ED Treatments / Results  Labs (all labs ordered are listed, but only abnormal results are displayed) Labs Reviewed  BASIC METABOLIC PANEL - Abnormal; Notable for the following:       Result Value   Glucose, Bld 128 (*)    All other components within normal limits  CBC - Abnormal; Notable for the following:    WBC 11.6 (*)    All other components within normal limits  TROPONIN I    EKG  EKG Interpretation  Date/Time:  Monday June 23 2017 21:06:15 EDT Ventricular Rate:  72 PR Interval:  178 QRS Duration: 88 QT Interval:  396 QTC Calculation: 433 R Axis:   59 Text Interpretation:  Normal sinus rhythm Normal ECG No significant change since last tracing Confirmed by Zadie Rhine ([id number]) on 06/23/2017 11:36:48 PM       Radiology Dg Chest 2 View  Result Date: 06/23/2017 CLINICAL DATA:  Central chest pain, dizziness and diaphoresis since this morning. EXAM: CHEST  2 VIEW COMPARISON:  04/16/2008 FINDINGS: The heart size and mediastinal contours are within normal limits. Both lungs are clear. No acute nor suspicious osseous abnormalities. Mild degeneratiive change along the midthoracic spine. IMPRESSION: No active cardiopulmonary disease. Electronically Signed   By: Tollie Eth M.D.   On: 06/23/2017 21:23    Procedures Procedures (including critical care time)  Medications Ordered in ED Medications  ketorolac (TORADOL) 30 MG/ML injection 30 mg (30 mg Intravenous Given 06/24/17 0001)  ondansetron (ZOFRAN) injection 4 mg (4 mg Intravenous Given 06/24/17 0001)  meclizine (ANTIVERT) tablet 25 mg (25 mg Oral Given 06/24/17 0001)  sodium chloride 0.9 % bolus 1,000 mL (0 mLs Intravenous  Stopped 06/24/17 0044)     Initial Impression / Assessment and Plan / ED Course  I have reviewed the triage vital signs and the nursing notes.  Pertinent labs & imaging results that were available during my care of the patient were reviewed by me and considered in my medical decision making (see chart for details).     Pt well appearing No distress No neuro deficits He had MRI since previous dissection and MRI was negative   Pt improved in the ED No neuro deficits I have low suspicion for recurrent carotid dissection or other neurologic catastrophe Will need BP check as outpatient We discussed strict ER return precautions  Final Clinical Impressions(s) / ED Diagnoses   Final diagnoses:  Vertigo  Essential hypertension    New Prescriptions Discharge Medication List as of 06/24/2017  1:15 AM       Zadie Rhine, MD 06/24/17 [phone]

## 2017-06-24 NOTE — Discharge Instructions (Signed)

## 2017-08-04 ENCOUNTER — Encounter (INDEPENDENT_AMBULATORY_CARE_PROVIDER_SITE_OTHER): Payer: Self-pay

## 2017-08-04 ENCOUNTER — Ambulatory Visit: Payer: Managed Care, Other (non HMO) | Admitting: Neurology

## 2017-08-04 ENCOUNTER — Encounter: Payer: Self-pay | Admitting: Neurology

## 2017-08-04 VITALS — Ht 70.0 in | Wt 170.0 lb

## 2017-08-04 DIAGNOSIS — H8112 Benign paroxysmal vertigo, left ear: Secondary | ICD-10-CM | POA: Diagnosis not present

## 2017-08-04 DIAGNOSIS — Z8679 Personal history of other diseases of the circulatory system: Secondary | ICD-10-CM | POA: Diagnosis not present

## 2017-08-04 NOTE — Progress Notes (Signed)
SLEEP MEDICINE CLINIC   Provider:  Melvyn Novas, M D  Primary Care Physician:  Gaspar Garbe, MD   Referring Provider:    Chief Complaint  Patient presents with  . New Patient (Initial Visit)    pt alone, rm 10. pt states dg in Feb 2018 with vertigo.     HPI:  Timothy Schmitt is a 52 y.o. male , seen here as in a referral  from Dr.Tisovec, after being seen in the [facility] health ED for Vertigo on 06-23-2017.   Mr. Carver had been treated for a carotid dissection in 2009.  He had been placed on antiplatelet therapy and recovered without any intervention at this time there has been no sign of new focal neurologic deficits, but vertigo.  It was associated with it.  Of high blood pressures nausea and vomiting.  He did not lose any function of cranial nerves otherwise, no hearing loss or tinnitus, no difficulties with swallowing no vision changes.  His blood pressure was 162/102 mmHg when he presented to the emergency room.  Dr. Bebe Shaggy also made a note that the patient did not have abdominal pain, had not fainted and had no history of recent traumatic brain injury concussion etc.  There was no fever found.  No chest pain no shortness of breath.  He had another evaluation earlier in February of this year when he also came to the emergency room with a feeling of lightheadedness or dizziness and received a diagnosis of vertigo.  At the  time an imaging study of the brain was obtained and read as normal.  CLINICAL DATA:  Vertigo, visual disturbance with facial tingling. History of left carotid dissection.  EXAM: MR HEAD WITHOUT CONTRAST  MR CIRCLE OF WILLIS WITHOUT CONTRAST  MRA OF THE NECK WITHOUT AND WITH CONTRAST  TECHNIQUE: Multiplanar, multiecho pulse sequences of the brain, circle of willis and surrounding structures were obtained without intravenous contrast. Angiographic images of the neck were obtained using MRA technique without and with intravenous  contrast.  CONTRAST:  15mL MULTIHANCE GADOBENATE DIMEGLUMINE 529 MG/ML IV SOLN  COMPARISON:  CTA head neck 04/16/2008, 07/14/2008  FINDINGS: MR HEAD FINDINGS  Brain: Ventricle size normal. Cerebral volume normal. Negative for acute or chronic infarction. Negative for hemorrhage mass or edema.  Vascular: Normal arterial flow voids.  Skull and upper cervical spine: Negative  Sinuses/Orbits: Mild mucosal edema in the paranasal sinuses. Normal orbit.  Other: None  MR CIRCLE OF WILLIS FINDINGS  Both vertebral arteries patent to the basilar. PICA patent bilaterally. Left AICA patent. Superior cerebellar and posterior cerebral arteries patent without stenosis. Fetal origin left posterior cerebral artery. Basilar is widely patent.  Distal internal carotid artery is patent through the cavernous segment without irregularity or stenosis. Prior dissection was at the level of the left carotid canal and this vessel appears normal today. Anterior and middle cerebral arteries appear normal bilaterally.  Negative for cerebral aneurysm.  MRA NECK FINDINGS  Normal aortic arch. Carotid bifurcation normal. Both vertebral arteries are normal. Antegrade flow in both carotid and vertebral arteries is noted.  IMPRESSION: Negative MRI of the brain  Negative MRA circle of Willis  Negative MRA neck  History of prior left carotid dissection at the level of the carotid canal at the skullbase. This has healed and appears normal.   Electronically Signed   By: Marlan Palau M.D.   On: 12/24/2016 15:54    Social history: Patient does not smoke tobacco, he does not drink alcohol,  he has recently cut down on his soft overall he does not feel that he consumes a lot of caffeine not in form of iced tea, energy drinks, coffee, or soda. 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   Review of Systems: Out of a  complete 14 system review, the patient complains of only the following symptoms, and all other reviewed systems are negative.  Chief complaint according to patient : The patient describes a feeling of being in motion ' running in circles'while not actually moving.  He describes that he is able to drive, walk he does not feel disoriented.  He denies diplopia, difficulties with swallowing or speaking.    Social History   Socioeconomic History  . Marital status: Married    Spouse name: Not on file  . Number of children: Not on file  . Years of education: Not on file  . Highest education level: Not on file  Social Needs  . Financial resource strain: Not on file  . Food insecurity - worry: Not on file  . Food insecurity - inability: Not on file  . Transportation needs - medical: Not on file  . Transportation needs - non-medical: Not on file  Occupational History  . Not on file  Tobacco Use  . Smoking status: Never Smoker  . Smokeless tobacco: Never Used  Substance and Sexual Activity  . Alcohol use: Not on file  . Drug use: Not on file  . Sexual activity: Not on file  Other Topics Concern  . Not on file  Social History Narrative  . Not on file    No family history on file.  No past medical history on file.  No past surgical history on file.  Current Outpatient Medications  Medication Sig Dispense Refill  . lisinopril-hydrochlorothiazide (PRINZIDE,ZESTORETIC) 20-25 MG tablet Take 1 tablet daily by mouth.     No current facility-administered medications for this visit.     Allergies as of 08/04/2017 - Review Complete 06/23/2017  Allergen Reaction Noted  . Codeine Nausea And Vomiting 12/24/2016    Vitals: Ht 5\' 10"  (1.778 m)   Wt 170 lb (77.1 kg)   BMI 24.39 kg/m  Last Weight:  Wt Readings from Last 1 Encounters:  08/04/17 170 lb (77.1 kg)   ZOX:WRUEBMI:Body mass index is 24.39 kg/m.     Last Height:   Ht Readings from Last 1 Encounters:  08/04/17 5\' 10"  (1.778 m)     Physical exam: The patient went orthostatic blood pressures.  Sitting blood pressure was 139/94 mmHg with a heart rate of 68 bpm, regular.  Standing blood pressure 121/86 mmHg with a heart rate of 66 bpm and regular supine 120/80 mmHg with a heart rate of 63.  General: The patient is awake, alert and appears not in acute distress. The patient is well groomed. Head: Normocephalic, atraumatic. Neck is supple. Mallampati 3,  neck circumference:15. Nasal airflow patent ,TMJ is not evident . Retrognathia is not seen.  Cardiovascular:  Regular rate and rhythm , without  murmurs or carotid bruit, and without distended neck veins. Respiratory: Lungs are clear to auscultation. Skin:  Without evidence of edema, or rash Trunk: BMI is normal . The patient's posture is erect.   Neurologic exam : The patient is awake and alert, oriented to place and time.   Memory subjective described as intact.  Attention span & concentration ability appears normal.  Speech is fluent,  without  dysarthria, dysphonia or aphasia.  Mood and affect are appropriate.  Cranial nerves: Pupils are equal and briskly reactive to light. Funduscopic exam without evidence of pallor or edema. Extraocular movements  in vertical and horizontal planes intact and without nystagmus. Visual fields by finger perimetry are intact. Hearing to finger rub intact. Facial sensation intact to fine touch. Facial motor strength is symmetric and tongue and uvula move midline. Shoulder shrug was symmetrical.   Motor exam: Normal tone, muscle bulk and symmetric strength in all extremities. Sensory:  Fine touch, pinprick and vibration were tested in all extremities. Proprioception tested in the upper extremities was normal. Coordination: Rapid alternating movements in the fingers/hands was normal. Finger-to-nose maneuver  normal without evidence of ataxia, dysmetria or tremor. Gait and station: Patient walks without assistive device and is able  unassisted to climb up to the exam table. Strength within normal limits.Stance is stable and normal.   Deep tendon reflexes: in the  upper and lower extremities are symmetric and intact. Babinski maneuver response is downgoing.   Assessment:  After physical and neurologic examination, review of laboratory studies,  Personal review of imaging studies, and pre-existing records as far as provided in visit., my assessment is   1)   the description of lightheadedness, the feeling of being a little bit off balance but still completely connected to his surroundings and aware of his surroundings and seems to wax and wane from day today.  The last time he had this feeling was yesterday, a week ago.  Today's orthostatic blood pressures showed a rise in blood pressure when sitting up but not when standing.  Heart rate barely compensated for this.  My concern is that the patient may have hypotension and stable blood pressure.  It is unlikely that this is an in the ER process, and based on his previous symptoms in 2009 he had been evaluated by MRI and MRA which were compared to a 2009 CTA.   No new aneurysm was found,  no abnormality in the cerebrovascular system at all. The symptoms seem to be triggered by rapid head and eye movements , suggestive of BPV.   2) The patient may need to continue lisinopril, HCTZ for a couple of weeks. - this is very newly prescribed, only abut one week. He has not had dizziness within this week.   3) no SOB, no Pressure in the chest. - further cardiac evaluation may include a heart monitor through PCP. He could be considered for a cardiac monitor.   The patient was advised of the nature of the diagnosed disorder , the treatment options and the  risks for general health and wellness arising from not treating the condition.   I spent more than 50 minutes of face to face time with the patient.  Greater than 50% of time was spent in counseling and coordination of care. We have  discussed the diagnosis and differential and I answered the patient's questions.    Plan:  Treatment plan and additional workup :  Antivert prn.  Hydrate well BPV - referral for PT vestibular Cardiac monitor through PCP. Family Doctor was Dr. Wylene Simmer , Debbe Bales is the NP in PCP.     Melvyn Novas, MD 08/04/2017, 2:29 PM  Certified in Neurology by [employer]BP[employer] Certified in Sleep Medicine by [facility] [address] [place], [place] [address]

## 2018-02-20 ENCOUNTER — Encounter: Payer: Self-pay | Admitting: Nurse Practitioner

## 2018-03-20 ENCOUNTER — Ambulatory Visit: Payer: Managed Care, Other (non HMO) | Admitting: Internal Medicine

## 2018-05-27 ENCOUNTER — Encounter (HOSPITAL_BASED_OUTPATIENT_CLINIC_OR_DEPARTMENT_OTHER): Payer: Self-pay | Admitting: *Deleted

## 2018-05-27 ENCOUNTER — Other Ambulatory Visit: Payer: Self-pay

## 2018-05-27 ENCOUNTER — Emergency Department (HOSPITAL_BASED_OUTPATIENT_CLINIC_OR_DEPARTMENT_OTHER): Payer: Managed Care, Other (non HMO)

## 2018-05-27 ENCOUNTER — Emergency Department (HOSPITAL_BASED_OUTPATIENT_CLINIC_OR_DEPARTMENT_OTHER)
Admission: EM | Admit: 2018-05-27 | Discharge: 2018-05-27 | Disposition: A | Discharge status: Home / Self Care | Payer: Managed Care, Other (non HMO) | Attending: Emergency Medicine | Admitting: Emergency Medicine

## 2018-05-27 DIAGNOSIS — N451 Epididymitis: Secondary | ICD-10-CM | POA: Insufficient documentation

## 2018-05-27 DIAGNOSIS — Z79899 Other long term (current) drug therapy: Secondary | ICD-10-CM | POA: Insufficient documentation

## 2018-05-27 DIAGNOSIS — I10 Essential (primary) hypertension: Secondary | ICD-10-CM | POA: Insufficient documentation

## 2018-05-27 DIAGNOSIS — N50811 Right testicular pain: Secondary | ICD-10-CM | POA: Diagnosis present

## 2018-05-27 HISTORY — DX: Essential (primary) hypertension: I10

## 2018-05-27 LAB — URINALYSIS, MICROSCOPIC (REFLEX)

## 2018-05-27 LAB — URINALYSIS, ROUTINE W REFLEX MICROSCOPIC
Bilirubin Urine: NEGATIVE
Glucose, UA: NEGATIVE mg/dL
Ketones, ur: NEGATIVE mg/dL
Leukocytes, UA: NEGATIVE
Nitrite: NEGATIVE
Protein, ur: NEGATIVE mg/dL
Specific Gravity, Urine: 1.025 (ref 1.005–1.030)
pH: 6 (ref 5.0–8.0)

## 2018-05-27 MED ORDER — IBUPROFEN 400 MG PO TABS: 600.0000 mg | ORAL_TABLET | Freq: Once | ORAL | Status: DC

## 2018-05-27 MED ORDER — LEVOFLOXACIN 500 MG PO TABS: 500.0000 mg | ORAL_TABLET | Freq: Every day | ORAL | 0 refills | Status: AC

## 2018-05-27 MED ORDER — IBUPROFEN 200 MG PO TABS
ORAL_TABLET | ORAL | Status: AC
  Administered 2018-05-27: 600 mg
  Filled 2018-05-27: qty 3

## 2018-05-27 MED ORDER — LEVOFLOXACIN 500 MG PO TABS: 500.0000 mg | ORAL_TABLET | Freq: Every day | ORAL | 0 refills | Status: DC

## 2018-05-27 MED ORDER — LEVOFLOXACIN 500 MG PO TABS
500.0000 mg | ORAL_TABLET | Freq: Once | ORAL | Status: AC
  Administered 2018-05-27: 500 mg via ORAL
  Filled 2018-05-27: qty 1

## 2018-05-27 NOTE — ED Triage Notes (Signed)
Pt c/o right scrotum pain x 5 hrs

## 2018-06-05 NOTE — ED Provider Notes (Signed)
[facility] EMERGENCY DEPARTMENT Provider Note   CSN: [id number] Arrival date & time: 05/27/18  1802     History   Chief Complaint Chief Complaint  Patient presents with  . Groin Pain    HPI Timothy Schmitt is a 52 y.o. male.  HPI   52 year old male with onset of right testicular pain.  Onset about 5 hours ago while at rest.  He describes severe pain which is worse with palpation and movement.  He feels more comfortable when he is absolutely still.  No urinary complaints.  No swelling.  No rash.  No discharge.  Reports that he is in a sexually monogamous relationship.  No back or flank pain.  Denies past history of kidney stones.  He is generally very active and does a lot of heavy lifting.  Past Medical History:  Diagnosis Date  . Hypertension     Patient Active Problem List   Diagnosis Date Noted  . History of carotid artery dissection 08/04/2017  . Benign positional vertigo, left 08/04/2017    History reviewed. No pertinent surgical history.      Home Medications    Prior to Admission medications   Medication Sig Start Date End Date Taking? Authorizing Provider  levofloxacin (LEVAQUIN) 500 MG tablet Take 1 tablet (500 mg total) by mouth daily. 05/27/18   Raeford Razor, MD  lisinopril-hydrochlorothiazide (PRINZIDE,ZESTORETIC) 20-25 MG tablet Take 1 tablet daily by mouth.    [provider]    Family History History reviewed. No pertinent family history.  Social History Social History   Tobacco Use  . Smoking status: Never Smoker  . Smokeless tobacco: Never Used  Substance Use Topics  . Alcohol use: Not Currently  . Drug use: Not Currently     Allergies   Codeine   Review of Systems Review of Systems  All systems reviewed and negative, other than as noted in HPI.  Physical Exam Updated Vital Signs BP 120/72 (BP Location: Right Arm)   Pulse 66   Temp 98.2 F (36.8 C) (Oral)   Resp 18   Ht 5\' 10"  (1.778 m)   Wt 72.6 kg    SpO2 98%   BMI 22.96 kg/m   Physical Exam  Constitutional: He appears well-developed and well-nourished. No distress.  HENT:  Head: Normocephalic and atraumatic.  Eyes: Conjunctivae are normal. Right eye exhibits no discharge. Left eye exhibits no discharge.  Neck: Neck supple.  Cardiovascular: Normal rate, regular rhythm and normal heart sounds. Exam reveals no gallop and no friction rub.  No murmur heard. Pulmonary/Chest: Effort normal and breath sounds normal. No respiratory distress.  Abdominal: Soft. He exhibits no distension. There is no tenderness.  Genitourinary:  Genitourinary Comments: External genitalia.  Right testicle is exquisitely tender to palpation to the point that he is pushing me away and it is limiting exam.  No urethral discharge.  No inguinal adenopathy.  No hernia appreciated.  Musculoskeletal: He exhibits no edema or tenderness.  Neurological: He is alert.  Skin: Skin is warm and dry.  Psychiatric: He has a normal mood and affect. His behavior is normal. Thought content normal.  Nursing note and vitals reviewed.    ED Treatments / Results  Labs (all labs ordered are listed, but only abnormal results are displayed) Labs Reviewed  URINALYSIS, ROUTINE W REFLEX MICROSCOPIC - Abnormal; Notable for the following components:      Result Value   Hgb urine dipstick TRACE (*)    All other components within normal  limits  URINALYSIS, MICROSCOPIC (REFLEX) - Abnormal; Notable for the following components:   Bacteria, UA RARE (*)    All other components within normal limits    EKG None  Radiology No results found.   US Scrotum  Result Date: 05/27/2018 CLINICAL DATA:  Sudden onset right-sided testicular pain 4 hours ago. EXAM: SCROTAL ULTRASOUND DOPPLER ULTRASOUND OF THE TESTICLES TECHNIQUE: Complete ultrasound examination of the testicles, epididymis, and other scrotal structures was performed. Color and spectral Doppler ultrasound were also utilized to  evaluate blood flow to the testicles. COMPARISON:  None. FINDINGS: Right testicle Measurements: 4.7 x 3.2 x 3.5 cm. No intratesticular mass. A few scattered microliths are noted. Left testicle Measurements: 4.2 x 2.5 x 3.2 cm. No intratesticular mass. A few scattered microliths are present. Right epididymis: Increased vascularity of the right epididymis without focal mass. Left epididymis:  Normal in size and appearance. Hydrocele: Small hydroceles with internal debris likely representing small complex hydroceles. Varicocele:  Left-sided varicoceles are present. Pulsed Doppler interrogation of both testes demonstrates normal low resistance arterial and venous waveforms bilaterally. IMPRESSION: 1. No testicular torsion or mass. 2. Hypervascular appearance of the right epididymis suspicious for right-sided epididymitis. 3. Bilateral testicular microlithiasis. Current literature suggests that testicular microlithiasis is not a significant independent risk factor for development of testicular carcinoma, and that follow up imaging is not warranted in the absence of other risk factors. Monthly testicular self-examination and annual physical exams are considered appropriate surveillance. If patient has other risk factors for testicular carcinoma, then referral to Urology should be considered. (Reference: DeCastro, et al.: A 5-Year Follow up Study of Asymptomatic Men with Testicular Microlithiasis. J Urol 2008; 179:1420-1423.) 4. Small complex hydroceles with internal debris. 5. Left-sided varicoceles. Electronically Signed   By: Tollie Eth M.D.   On: 05/27/2018 19:57   US Scrotum Doppler  Result Date: 05/27/2018 CLINICAL DATA:  Sudden onset right-sided testicular pain 4 hours ago. EXAM: SCROTAL ULTRASOUND DOPPLER ULTRASOUND OF THE TESTICLES TECHNIQUE: Complete ultrasound examination of the testicles, epididymis, and other scrotal structures was performed. Color and spectral Doppler ultrasound were also utilized to  evaluate blood flow to the testicles. COMPARISON:  None. FINDINGS: Right testicle Measurements: 4.7 x 3.2 x 3.5 cm. No intratesticular mass. A few scattered microliths are noted. Left testicle Measurements: 4.2 x 2.5 x 3.2 cm. No intratesticular mass. A few scattered microliths are present. Right epididymis: Increased vascularity of the right epididymis without focal mass. Left epididymis:  Normal in size and appearance. Hydrocele: Small hydroceles with internal debris likely representing small complex hydroceles. Varicocele:  Left-sided varicoceles are present. Pulsed Doppler interrogation of both testes demonstrates normal low resistance arterial and venous waveforms bilaterally. IMPRESSION: 1. No testicular torsion or mass. 2. Hypervascular appearance of the right epididymis suspicious for right-sided epididymitis. 3. Bilateral testicular microlithiasis. Current literature suggests that testicular microlithiasis is not a significant independent risk factor for development of testicular carcinoma, and that follow up imaging is not warranted in the absence of other risk factors. Monthly testicular self-examination and annual physical exams are considered appropriate surveillance. If patient has other risk factors for testicular carcinoma, then referral to Urology should be considered. (Reference: DeCastro, et al.: A 5-Year Follow up Study of Asymptomatic Men with Testicular Microlithiasis. J Urol 2008; 179:1420-1423.) 4. Small complex hydroceles with internal debris. 5. Left-sided varicoceles. Electronically Signed   By: Tollie Eth M.D.   On: 05/27/2018 19:57    Procedures Procedures (including critical care time)  Medications Ordered in ED Medications  ibuprofen (ADVIL,MOTRIN) 200 MG tablet (600 mg  Given 05/27/18 1841)  levofloxacin (LEVAQUIN) tablet 500 mg (500 mg Oral Given 05/27/18 2017)     Initial Impression / Assessment and Plan / ED Course  I have reviewed the triage vital signs and the  nursing notes.  Pertinent labs & imaging results that were available during my care of the patient were reviewed by me and considered in my medical decision making (see chart for details).     52 year old male with right testicular pain.  Imaging significant for epididymitis.  No signs of torsion.  He reports that he is in a sexually monogamous relationship.  He is not specifically concerned for STDs.  Given this history and his age, will treat with Levaquin.  He has been doing a lot of heavy lifting and straining recently.  This may have been potentially precipitated by reflux.  Regardless, I feel is appropriate for outpatient treatment.  Return precautions discussed.  Urology follow-up as needed.  I have reviewed the triage vital signs and the nursing notes. Prior records were reviewed for additional information.    Pertinent labs & imaging results that were available during my care of the patient were reviewed by me and considered in my medical decision making (see chart for details).   Final Clinical Impressions(s) / ED Diagnoses   Final diagnoses:  Pain in right testicle  Epididymitis    ED Discharge Orders         Ordered    levofloxacin (LEVAQUIN) 500 MG tablet  Daily,   Status:  Discontinued     05/27/18 2023    levofloxacin (LEVAQUIN) 500 MG tablet  Daily     05/27/18 2025           Raeford Razor, MD 06/05/18 1142

## 2019-03-07 IMAGING — US US SCROTUM W/ DOPPLER COMPLETE
1 series · 13 of 25 positions shown · non-contrast
Comparison: None.

CLINICAL DATA: Sudden onset right-sided testicular pain 4 hours
ago.

EXAM:
SCROTAL ULTRASOUND
DOPPLER ULTRASOUND OF THE TESTICLES
TECHNIQUE: Complete ultrasound examination of the testicles, epididymis, and
other scrotal structures was performed. Color and spectral Doppler
ultrasound were also utilized to evaluate blood flow to the
testicles.

[Series 1: us scrotum w/ doppler complete · 0.07mm/px · 13 of 29 slices shown]
[im 1/29]
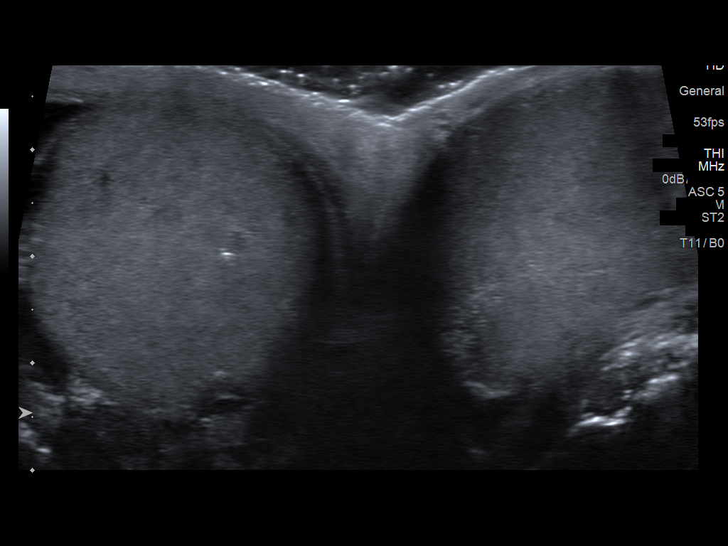
[im 3/29]
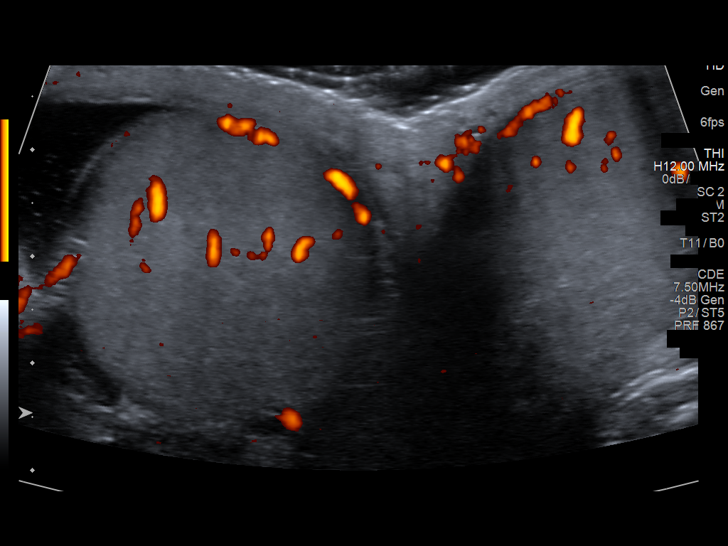
[im 5/29]
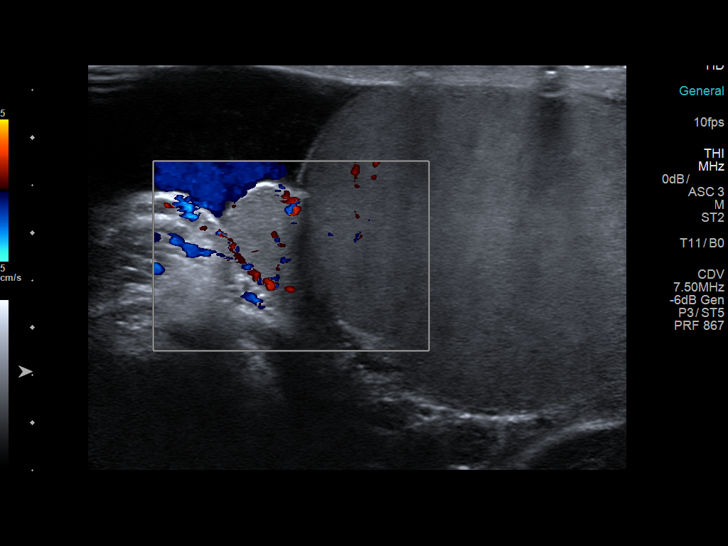
[im 8/29]
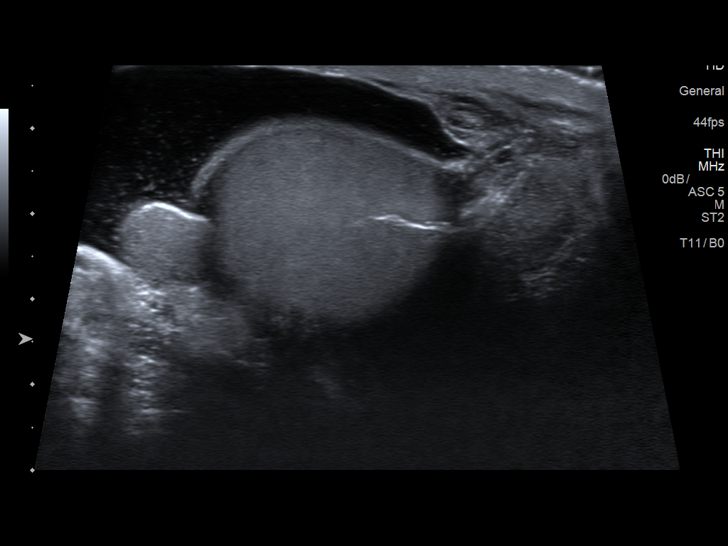
[im 10/29]
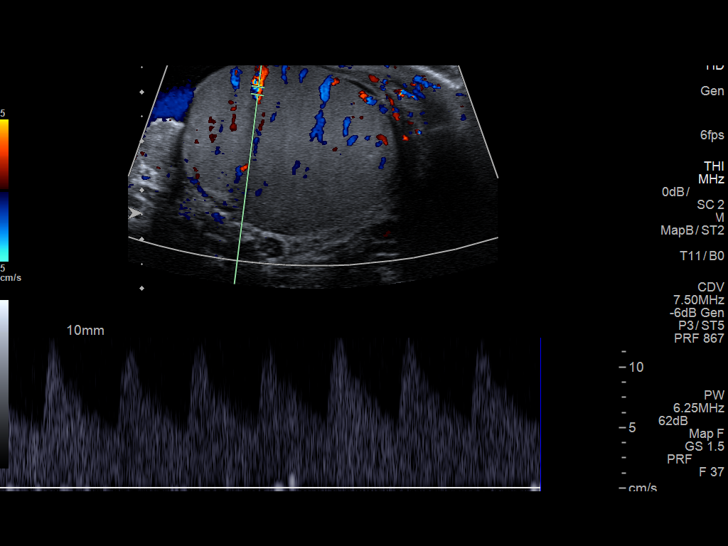
[im 12/29]
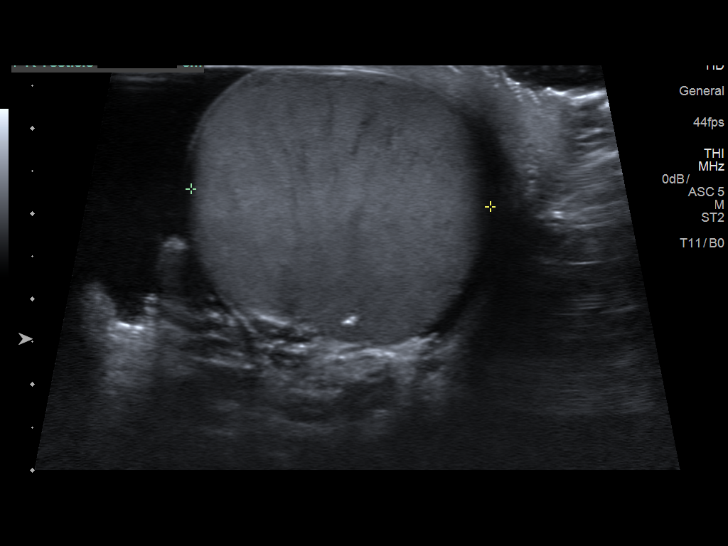
[im 15/29]
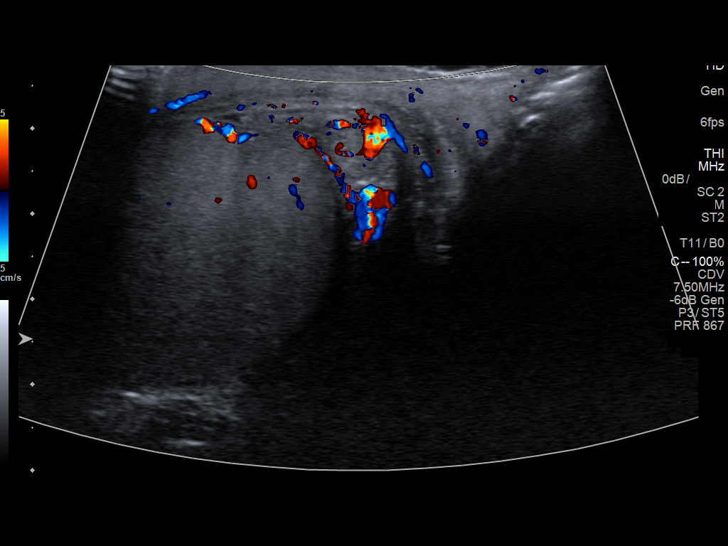
[im 17/29]
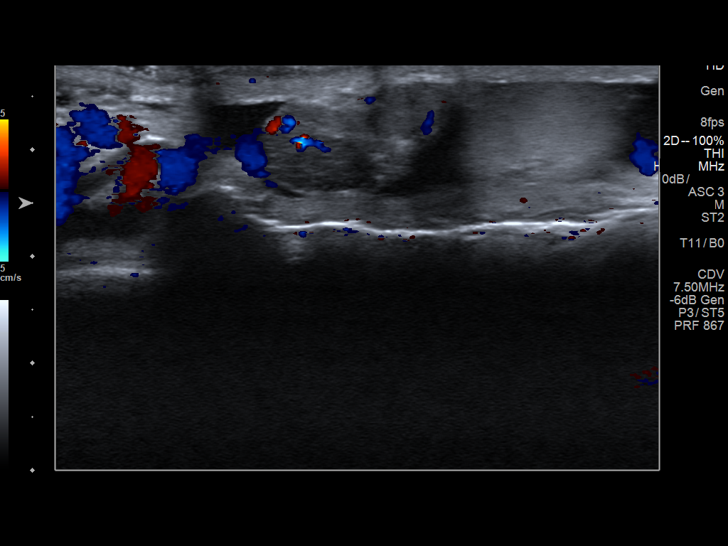
[im 19/29]
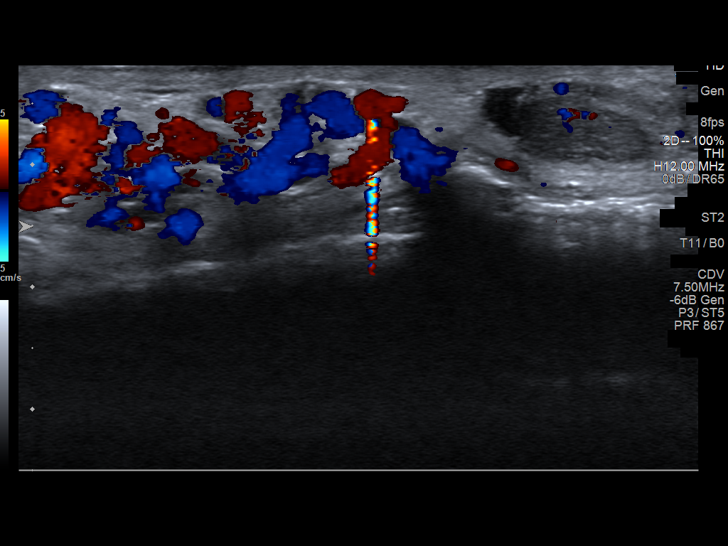
[im 22/29]
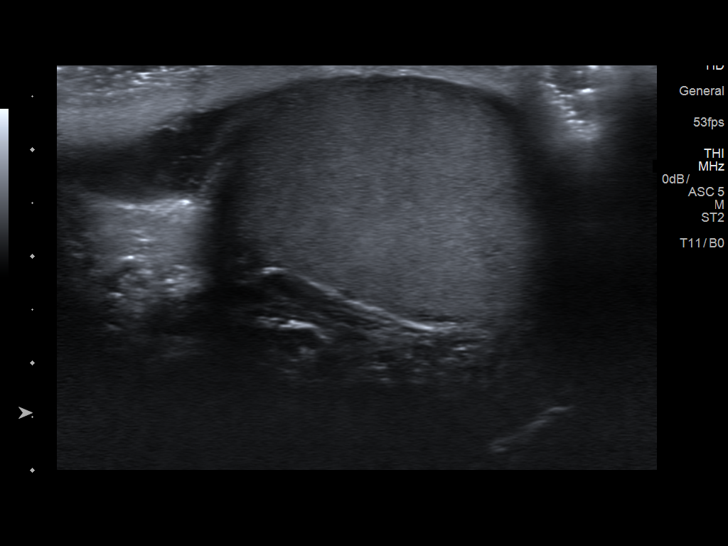
[im 24/29]
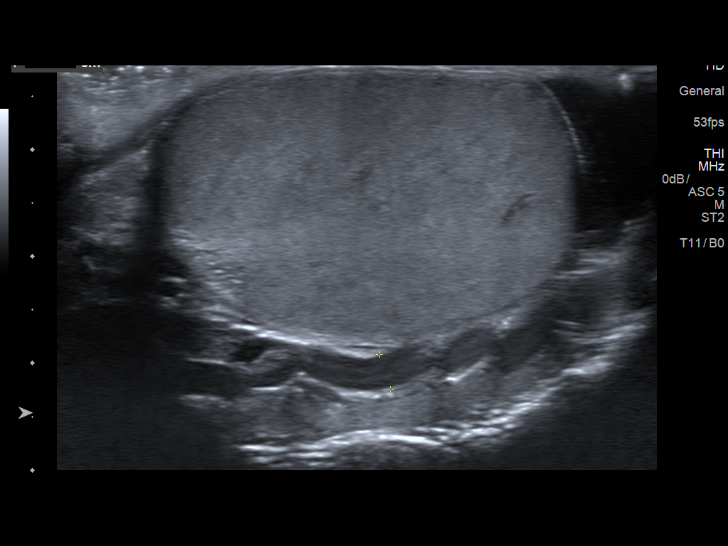
[im 26/29]
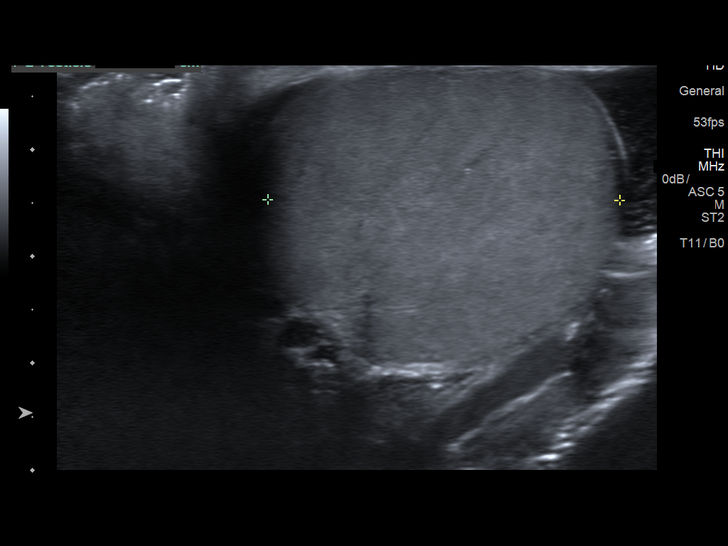
[im 29/29]
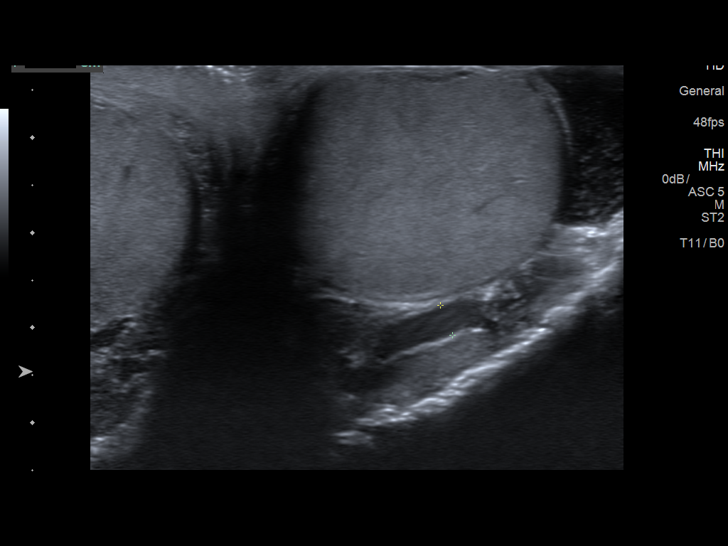

[13 of 25 positions shown; findings below may reference images not displayed]

FINDINGS: Right testicle

Measurements: 4.7 x 3.2 x 3.5 cm. No intratesticular mass. A few
scattered microliths are noted.

Left testicle

Measurements: 4.2 x 2.5 x 3.2 cm. No intratesticular mass. A few
scattered microliths are present.

Right epididymis: Increased vascularity of the right epididymis
without focal mass.

Left epididymis:  Normal in size and appearance.

Hydrocele: Small hydroceles with internal debris likely representing
small complex hydroceles.

Varicocele:  Left-sided varicoceles are present.

Pulsed Doppler interrogation of both testes demonstrates normal low
resistance arterial and venous waveforms bilaterally.
IMPRESSION: 1. No testicular torsion or mass.
2. Hypervascular appearance of the right epididymis suspicious for
right-sided epididymitis.
3. Bilateral testicular microlithiasis. Current literature suggests
that testicular microlithiasis is not a significant independent risk
factor for development of testicular carcinoma, and that follow up
imaging is not warranted in the absence of other risk factors.
Monthly testicular self-examination and annual physical exams are
considered appropriate surveillance. If patient has other risk
factors for testicular carcinoma, then referral to Urology should be
considered. (Reference: Bekele, et al.: A 5-Year Follow up Study
of Asymptomatic Men with Testicular Microlithiasis. J Urol 7775;
179:9228-9222.)
4. Small complex hydroceles with internal debris.
5. Left-sided varicoceles.

## 2019-08-25 ENCOUNTER — Other Ambulatory Visit: Payer: Self-pay

## 2019-08-25 DIAGNOSIS — Z20822 Contact with and (suspected) exposure to covid-19: Secondary | ICD-10-CM

## 2019-08-26 LAB — NOVEL CORONAVIRUS, NAA: SARS-CoV-2, NAA: NOT DETECTED
